# Patient Record
Sex: Female | Born: 1995 | Race: Black or African American | Hispanic: No | Marital: Single | Smoking: Current every day smoker
Health system: Southern US, Community
[De-identification: ages and names within clinical notes are randomized; demographics above are authoritative.]

---

## 2017-07-01 ENCOUNTER — Emergency Department (HOSPITAL_COMMUNITY)
Admission: EM | Admit: 2017-07-01 | Discharge: 2017-07-01 | Disposition: A | Discharge status: Home / Self Care | Payer: Self-pay | Attending: Emergency Medicine | Admitting: Emergency Medicine

## 2017-07-01 ENCOUNTER — Encounter (HOSPITAL_COMMUNITY): Payer: Self-pay

## 2017-07-01 ENCOUNTER — Emergency Department (HOSPITAL_COMMUNITY): Payer: Self-pay

## 2017-07-01 DIAGNOSIS — R110 Nausea: Secondary | ICD-10-CM

## 2017-07-01 DIAGNOSIS — N949 Unspecified condition associated with female genital organs and menstrual cycle: Secondary | ICD-10-CM

## 2017-07-01 DIAGNOSIS — R102 Pelvic and perineal pain: Secondary | ICD-10-CM | POA: Insufficient documentation

## 2017-07-01 DIAGNOSIS — Z3A01 Less than 8 weeks gestation of pregnancy: Secondary | ICD-10-CM | POA: Insufficient documentation

## 2017-07-01 DIAGNOSIS — O99331 Smoking (tobacco) complicating pregnancy, first trimester: Secondary | ICD-10-CM | POA: Insufficient documentation

## 2017-07-01 DIAGNOSIS — O26891 Other specified pregnancy related conditions, first trimester: Secondary | ICD-10-CM | POA: Insufficient documentation

## 2017-07-01 DIAGNOSIS — N8311 Corpus luteum cyst of right ovary: Secondary | ICD-10-CM | POA: Insufficient documentation

## 2017-07-01 DIAGNOSIS — F17200 Nicotine dependence, unspecified, uncomplicated: Secondary | ICD-10-CM | POA: Insufficient documentation

## 2017-07-01 LAB — I-STAT BETA HCG BLOOD, ED (MC, WL, AP ONLY)

## 2017-07-01 LAB — WET PREP, GENITAL
Clue Cells Wet Prep HPF POC: NONE SEEN
SPERM: NONE SEEN
TRICH WET PREP: NONE SEEN
YEAST WET PREP: NONE SEEN

## 2017-07-01 LAB — LIPASE, BLOOD: Lipase: 23 U/L (ref 11–51)

## 2017-07-01 LAB — URINALYSIS, ROUTINE W REFLEX MICROSCOPIC
Bilirubin Urine: NEGATIVE
GLUCOSE, UA: NEGATIVE mg/dL
HGB URINE DIPSTICK: NEGATIVE
Ketones, ur: 5 mg/dL — AB
LEUKOCYTES UA: NEGATIVE
NITRITE: NEGATIVE
PROTEIN: 100 mg/dL — AB
SPECIFIC GRAVITY, URINE: 1.021 (ref 1.005–1.030)
pH: 6 (ref 5.0–8.0)

## 2017-07-01 LAB — COMPREHENSIVE METABOLIC PANEL
ALBUMIN: 4 g/dL (ref 3.5–5.0)
ALK PHOS: 56 U/L (ref 38–126)
ALT: 12 U/L — AB (ref 14–54)
AST: 22 U/L (ref 15–41)
Anion gap: 8 (ref 5–15)
BILIRUBIN TOTAL: 0.6 mg/dL (ref 0.3–1.2)
BUN: 9 mg/dL (ref 6–20)
CO2: 21 mmol/L — ABNORMAL LOW (ref 22–32)
CREATININE: 0.83 mg/dL (ref 0.44–1.00)
Calcium: 9.1 mg/dL (ref 8.9–10.3)
Chloride: 109 mmol/L (ref 101–111)
GFR calc Af Amer: >60 mL/min (ref >60)
Glucose, Bld: 88 mg/dL (ref 65–99)
Potassium: 3.8 mmol/L (ref 3.5–5.1)
Sodium: 138 mmol/L (ref 135–145)
TOTAL PROTEIN: 6.8 g/dL (ref 6.5–8.1)

## 2017-07-01 LAB — HCG, QUANTITATIVE, PREGNANCY: HCG, BETA CHAIN, QUANT, S: 6956 m[IU]/mL — AB (ref <5)

## 2017-07-01 LAB — CBC
HEMATOCRIT: 37.9 % (ref 36.0–46.0)
Hemoglobin: 13 g/dL (ref 12.0–15.0)
MCH: 33.3 pg (ref 26.0–34.0)
MCHC: 34.3 g/dL (ref 30.0–36.0)
MCV: 97.2 fL (ref 78.0–100.0)
PLATELETS: 268 10*3/uL (ref 150–400)
RBC: 3.9 MIL/uL (ref 3.87–5.11)
RDW: 11.5 % (ref 11.5–15.5)
WBC: 9.6 10*3/uL (ref 4.0–10.5)

## 2017-07-01 MED ORDER — ONDANSETRON HCL 4 MG/2ML IJ SOLN
4.0000 mg | Freq: Once | INTRAMUSCULAR | Status: AC
  Administered 2017-07-01: 4 mg via INTRAVENOUS
  Filled 2017-07-01: qty 2

## 2017-07-01 MED ORDER — SODIUM CHLORIDE 0.9 % IV BOLUS (SEPSIS)
1000.0000 mL | Freq: Once | INTRAVENOUS | Status: AC
  Administered 2017-07-01: 1000 mL via INTRAVENOUS

## 2017-07-01 MED ORDER — PRENATAL COMPLETE 14-0.4 MG PO TABS: 1.0000 | ORAL_TABLET | Freq: Every day | ORAL | 2 refills | Status: AC

## 2017-07-01 MED ORDER — ONDANSETRON 4 MG PO TBDP: 4.0000 mg | ORAL_TABLET | Freq: Three times a day (TID) | ORAL | 0 refills | Status: AC | PRN

## 2017-07-01 NOTE — ED Triage Notes (Signed)
Pt reports waking up this morning with LLQ pain and nausea. Denies emesis, fever, chills, diarrhea. Last bm 06/30/17. Denies urinary symptoms

## 2017-07-01 NOTE — ED Provider Notes (Signed)
[facility]ENC[facility] Provider Note   CSN: [id number] Arrival date & time: 07/01/17  1307     History   Chief Complaint Chief Complaint  Patient presents with  . Abdominal Pain  . Nausea    HPI Kaitlin Hammond is a 21 y.o. otherwise healthy [id number] female with a PMHx of SAb several years ago but otherwise no medical problems, who presents to the ED with complaints of LLQ abdominal pain that began at 8 AM this morning after she woke up from sleep. She describes the pain is 7/10 intermittent tugging nonradiating LLQ pain with no known aggravating or alleviating factors, she has not tried anything for her symptoms. She reports associated nausea. LMP was approximately 9/27-10/4 (therefore EGA 4513w0d). She has no PCP or OBGYN here as she just moved here from [place]. She is sexually active with 1 female partner, unprotected. She denies fevers, chills, CP, SOB, vomiting, diarrhea/constipation, obstipation, melena, hematochezia, hematuria, dysuria, vaginal bleeding/discharge, myalgias, arthralgias, numbness, tingling, focal weakness, or any other complaints at this time. Denies recent travel, sick contacts, suspicious food intake, EtOH use, NSAID use, or prior abd surgeries.    The history is provided by the patient and medical records. No language interpreter was used.  Abdominal Pain   This is a new problem. The current episode started 6 to 12 hours ago. The problem occurs rarely. The problem has not changed since onset.The pain is associated with an unknown factor. The pain is located in the LLQ. Quality: tugging. The pain is at a severity of 7/10. The pain is moderate. Associated symptoms include nausea. Pertinent negatives include fever, diarrhea, flatus, hematochezia, melena, vomiting, constipation, dysuria, hematuria, arthralgias and myalgias. Nothing aggravates the symptoms. Nothing relieves the symptoms.    History reviewed. No pertinent past medical  history.  There are no active problems to display for this patient.   History reviewed. No pertinent surgical history.  OB History    No data available       Home Medications    Prior to Admission medications   Not on File    Family History No family history on file.  Social History Social History  Substance Use Topics  . Smoking status: Current Every Day Smoker  . Smokeless tobacco: Never Used  . Alcohol use Yes     Allergies   Shellfish allergy   Review of Systems Review of Systems  Constitutional: Negative for chills and fever.  Respiratory: Negative for shortness of breath.   Cardiovascular: Negative for chest pain.  Gastrointestinal: Positive for abdominal pain and nausea. Negative for blood in stool, constipation, diarrhea, flatus, hematochezia, melena and vomiting.  Genitourinary: Negative for dysuria, hematuria, vaginal bleeding and vaginal discharge.  Musculoskeletal: Negative for arthralgias and myalgias.  Skin: Negative for color change.  Allergic/Immunologic: Negative for immunocompromised state.  Neurological: Negative for weakness and numbness.  Psychiatric/Behavioral: Negative for confusion.   All other systems reviewed and are negative for acute change except as noted in the HPI.    Physical Exam Updated Vital Signs BP 102/74   Pulse 82   Temp 98.5 F (36.9 C) (Oral)   Resp 16   Ht 5\' 7"  (1.702 m)   Wt 52.6 kg (116 lb)   LMP 05/01/2017   SpO2 100%   BMI 18.17 kg/m    Physical Exam  Constitutional: She is oriented to person, place, and time. Vital signs are normal. She appears well-developed and well-nourished.  Non-toxic appearance. No distress.  Afebrile, nontoxic,  NAD  HENT:  Head: Normocephalic and atraumatic.  Mouth/Throat: Oropharynx is clear and moist and mucous membranes are normal.  Eyes: Conjunctivae and EOM are normal. Right eye exhibits no discharge. Left eye exhibits no discharge.  Neck: Normal range of motion. Neck  supple.  Cardiovascular: Normal rate, regular rhythm, normal heart sounds and intact distal pulses.  Exam reveals no gallop and no friction rub.   No murmur heard. Pulmonary/Chest: Effort normal and breath sounds normal. No respiratory distress. She has no decreased breath sounds. She has no wheezes. She has no rhonchi. She has no rales.  Abdominal: Soft. Normal appearance and bowel sounds are normal. She exhibits no distension. There is tenderness in the left lower quadrant. There is no rigidity, no rebound, no guarding, no CVA tenderness, no tenderness at McBurney's point and negative Murphy's sign.  Soft, nondistended, +BS throughout, with mild LLQ TTP, no r/g/r, neg murphy's, neg mcburney's, no CVA TTP   Genitourinary:  Genitourinary Comments: Pt declined pelvic exam  Musculoskeletal: Normal range of motion.  Neurological: She is alert and oriented to person, place, and time. She has normal strength. No sensory deficit.  Skin: Skin is warm, dry and intact. No rash noted.  Psychiatric: She has a normal mood and affect.  Nursing note and vitals reviewed.    ED Treatments / Results  Labs (all labs ordered are listed, but only abnormal results are displayed) Labs Reviewed  WET PREP, GENITAL - Abnormal; Notable for the following:       Result Value   WBC, Wet Prep HPF POC MODERATE (*)    All other components within normal limits  COMPREHENSIVE METABOLIC PANEL - Abnormal; Notable for the following:    CO2 21 (*)    ALT 12 (*)    All other components within normal limits  URINALYSIS, ROUTINE W REFLEX MICROSCOPIC - Abnormal; Notable for the following:    APPearance HAZY (*)    Ketones, ur 5 (*)    Protein, ur 100 (*)    Bacteria, UA RARE (*)    Squamous Epithelial / LPF 6-30 (*)    All other components within normal limits  HCG, QUANTITATIVE, PREGNANCY - Abnormal; Notable for the following:    hCG, Beta Chain, Quant, S 6,956 (*)    All other components within normal limits  I-STAT  BETA HCG BLOOD, ED ([facility], [facility], [facility] ONLY) - Abnormal; Notable for the following:    I-stat hCG, quantitative >2,000.0 (*)    All other components within normal limits  LIPASE, BLOOD  CBC  RPR  HIV ANTIBODY (ROUTINE TESTING)  GC/CHLAMYDIA PROBE AMP ([place]) NOT AT [facility]    EKG  EKG Interpretation None       Radiology US Ob Comp Less 14 Wks  Result Date: 07/01/2017 CLINICAL DATA:  Initial evaluation for acute left lower pelvic pain. EXAM: OBSTETRIC <14 WK [place] AND TRANSVAGINAL OB US DOPPLER ULTRASOUND OF OVARIES TECHNIQUE: Both transabdominal and transvaginal ultrasound examinations were performed for complete evaluation of the gestation as well as the maternal uterus, adnexal regions, and pelvic cul-de-sac. Transvaginal technique was performed to assess early pregnancy. Color and duplex Doppler ultrasound was utilized to evaluate blood flow to the ovaries. COMPARISON:  None. FINDINGS: Intrauterine gestational sac: Single Yolk sac:  Present Embryo:  Not visualized. Cardiac Activity: N/A. Heart Rate: N/A bpm MSD: 6.4  mm   5 w   2  d Subchorionic hemorrhage:  None visualized. Maternal uterus/adnexae: Left ovary normal measuring 4.1 x 1.2 x  1.8 cm. Right ovary measures 3.4 x 2.5 x 3.1 cm. Complex cystic lesion within the right ovary measures 2.4 x 1.3 x 2.2 cm, most likely a corpus luteal cyst. Pulsed Doppler evaluation of both ovaries demonstrates normal appearing low-resistance arterial and venous waveforms. Trace free fluid noted within the pelvis. IMPRESSION: 1. Probable early intrauterine gestational sac with internal yolk sac, but no fetal pole or cardiac activity yet visualized. Estimated gestational age [redacted] weeks and 2 days by mean sac diameter. Recommend follow-up quantitative B-HCG levels and follow-up US in 14 days to assess viability. This recommendation follows SRU consensus guidelines: Diagnostic Criteria for Nonviable Pregnancy Early in the First Trimester. Malva Limes Med 2013; 161:0960-45.  2. 2.4 cm complex right ovarian cyst, most consistent with a normal corpus luteal cyst. Associated trace free pelvic fluid. 3. No other acute abnormality within the pelvis. No evidence for ovarian torsion. Electronically Signed   By: Rise Mu M.D.   On: 07/01/2017 19:05   US Ob Transvaginal  Result Date: 07/01/2017 CLINICAL DATA:  Initial evaluation for acute left lower pelvic pain. EXAM: OBSTETRIC <14 WK [place] AND TRANSVAGINAL OB US DOPPLER ULTRASOUND OF OVARIES TECHNIQUE: Both transabdominal and transvaginal ultrasound examinations were performed for complete evaluation of the gestation as well as the maternal uterus, adnexal regions, and pelvic cul-de-sac. Transvaginal technique was performed to assess early pregnancy. Color and duplex Doppler ultrasound was utilized to evaluate blood flow to the ovaries. COMPARISON:  None. FINDINGS: Intrauterine gestational sac: Single Yolk sac:  Present Embryo:  Not visualized. Cardiac Activity: N/A. Heart Rate: N/A bpm MSD: 6.4  mm   5 w   2  d Subchorionic hemorrhage:  None visualized. Maternal uterus/adnexae: Left ovary normal measuring 4.1 x 1.2 x 1.8 cm. Right ovary measures 3.4 x 2.5 x 3.1 cm. Complex cystic lesion within the right ovary measures 2.4 x 1.3 x 2.2 cm, most likely a corpus luteal cyst. Pulsed Doppler evaluation of both ovaries demonstrates normal appearing low-resistance arterial and venous waveforms. Trace free fluid noted within the pelvis. IMPRESSION: 1. Probable early intrauterine gestational sac with internal yolk sac, but no fetal pole or cardiac activity yet visualized. Estimated gestational age [redacted] weeks and 2 days by mean sac diameter. Recommend follow-up quantitative B-HCG levels and follow-up US in 14 days to assess viability. This recommendation follows SRU consensus guidelines: Diagnostic Criteria for Nonviable Pregnancy Early in the First Trimester. Malva Limes Med 2013; 409:8119-14. 2. 2.4 cm complex right ovarian cyst, most consistent  with a normal corpus luteal cyst. Associated trace free pelvic fluid. 3. No other acute abnormality within the pelvis. No evidence for ovarian torsion. Electronically Signed   By: Rise Mu M.D.   On: 07/01/2017 19:05   US Pelvic Doppler (torsion R/o Or Mass Arterial Flow)  Result Date: 07/01/2017 CLINICAL DATA:  Initial evaluation for acute left lower pelvic pain. EXAM: OBSTETRIC <14 WK [place] AND TRANSVAGINAL OB US DOPPLER ULTRASOUND OF OVARIES TECHNIQUE: Both transabdominal and transvaginal ultrasound examinations were performed for complete evaluation of the gestation as well as the maternal uterus, adnexal regions, and pelvic cul-de-sac. Transvaginal technique was performed to assess early pregnancy. Color and duplex Doppler ultrasound was utilized to evaluate blood flow to the ovaries. COMPARISON:  None. FINDINGS: Intrauterine gestational sac: Single Yolk sac:  Present Embryo:  Not visualized. Cardiac Activity: N/A. Heart Rate: N/A bpm MSD: 6.4  mm   5 w   2  d Subchorionic hemorrhage:  None visualized. Maternal uterus/adnexae: Left ovary  normal measuring 4.1 x 1.2 x 1.8 cm. Right ovary measures 3.4 x 2.5 x 3.1 cm. Complex cystic lesion within the right ovary measures 2.4 x 1.3 x 2.2 cm, most likely a corpus luteal cyst. Pulsed Doppler evaluation of both ovaries demonstrates normal appearing low-resistance arterial and venous waveforms. Trace free fluid noted within the pelvis. IMPRESSION: 1. Probable early intrauterine gestational sac with internal yolk sac, but no fetal pole or cardiac activity yet visualized. Estimated gestational age [redacted] weeks and 2 days by mean sac diameter. Recommend follow-up quantitative B-HCG levels and follow-up US in 14 days to assess viability. This recommendation follows SRU consensus guidelines: Diagnostic Criteria for Nonviable Pregnancy Early in the First Trimester. Malva Limes Med 2013; 161:0960-45. 2. 2.4 cm complex right ovarian cyst, most consistent with a normal  corpus luteal cyst. Associated trace free pelvic fluid. 3. No other acute abnormality within the pelvis. No evidence for ovarian torsion. Electronically Signed   By: Rise Mu M.D.   On: 07/01/2017 19:05    Procedures Procedures (including critical care time)  Medications Ordered in ED Medications  sodium chloride 0.9 % bolus 1,000 mL (1,000 mLs Intravenous New Bag/Given 07/01/17 1700)  ondansetron (ZOFRAN) injection 4 mg (4 mg Intravenous Given 07/01/17 1700)     Initial Impression / Assessment and Plan / ED Course  I have reviewed the triage vital signs and the nursing notes.  Pertinent labs & imaging results that were available during my care of the patient were reviewed by me and considered in my medical decision making (see chart for details).     21 y.o. female here with LLQ pain and nausea since waking up today. On exam, mild LLQ TTP, nonperitoneal; afebrile and nontoxic. Work up thus far reveals: CBC WNL, CMP essentially WNL, lipase WNL, U/A with gross contamination with 6-30 squamous but only 0-5 WBCs and rare bacteria so doubt UTI and doubt benefit of UCx with this grossly contaminated specimen. Istat betaHCG+. Pt declines pelvic exam, will get pelvic U/S to eval for IUP vs ectopic, etc, and get STD check and quantHCG; will get wet prep on blind vaginal swab performed by patient. Will give fluids and zofran (r/b/a discussed with pt and she wishes to proceed with zofran use), pt declines wanting anything for pain, will reassess shortly.   7:17 PM Wet prep with moderate WBCs but otherwise negative. QuantHCG 4098. Pelvic U/S confirms probable early IUP with yolk sac but no fetal pole or cardiac activity yet, sac measures [redacted]w[redacted]d which is consistent with LMP EGA. Also showing R ovarian cyst, likely corpus luteal cyst.  Advised starting prenatals, discussed pelvic rest, tylenol for pain but no NSAIDs, stay hydrated, f/up with OBGYN/women's clinic in 5-7 days to establish prenatal  care and for recheck after today's visit. Strict return precautions discussed, advised going to women's hospital for emergent changes/worsening in symptoms. Discussed abstinence until STD results come back, and f/up with health dept for future STD concerns; Safe sex encouraged, and discussed having partners tested and treated before re-engaging in intercourse after results come back. Will also rx zofran although discussed use of other OTC remedies first and only sparing use of zofran. I explained the diagnosis and have given explicit precautions to return to the ER including for any other new or worsening symptoms. The patient understands and accepts the medical plan as it's been dictated and I have answered their questions. Discharge instructions concerning home care and prescriptions have been given. The patient is STABLE and is  discharged to home in good condition.    Final Clinical Impressions(s) / ED Diagnoses   Final diagnoses:  Pelvic pain in pregnancy, antepartum, first trimester  Nausea  Round ligament pain  Corpus luteum cyst of right ovary    New Prescriptions New Prescriptions   ONDANSETRON (ZOFRAN ODT) 4 MG DISINTEGRATING TABLET    Take 1 tablet (4 mg total) by mouth every 8 (eight) hours as needed for nausea or vomiting.   PRENATAL VIT-FE FUMARATE-FA (PRENATAL COMPLETE) 14-0.4 MG TABS    Take 1 tablet by mouth daily after breakfast.     [address] [place], [place] 07/01/17 Earlean Polka, MD 07/03/17 [phone]

## 2017-07-01 NOTE — Discharge Instructions (Signed)
Pelvic pain in early pregnancy is common, it's usually due to the stretching that occurs to your pelvic organs as the fetus grows. Stay well hydrated and get plenty of rest. Allow your pelvis to rest until symptoms improve, avoiding putting anything in your vagina or having any sexual intercourse until you are rechecked by an OBGYN.   Start taking prenatal vitamins. You can use tylenol safely in pregnancy, use as needed for pain, but avoid NSAIDs like ibuprofen or aleve/motrin/etc. Use over the counter unisom + vitamin B6 to help with nausea, and see the list of foods below that can also help with nausea in pregnancy; if this doesn't help, then use zofran as directed as needed for additional relief of symptoms. Follow the instructions below regarding your diagnosis today. Establish prenatal care with an OBGYN, you can call the women's outpatient clinic to do this, and you will need to be checked in 5-7 days for re-evaluation. [facility] (similar to their version of an emergency room) for changes/worsening symptoms as outlined below.  Also, you have been tested for gonorrhea, chlamydia, HIV, and Syphilis, and the hospital lab will call you if the tests are positive. Do not have sexual intercourse until you've found out about your test results. Make sure all sexual partners are tested and treated before re-engaging in sexual intercourse. Follow up with the health department for future STD concerns/treatment/testing/etc.     HOME CARE INSTRUCTIONS DO NOT USE TAMPONS. Do not douche, have sexual intercourse or orgasms until approved by your caregiver.   SEEK [facility]MM[facility] ATTENTION [facility] IF: You have severe cramps in your stomach, back, or abdomen.  You have a sudden onset of severe pain in the lower part of your abdomen.  You run an unexplained temperature of 101 F (38.3 C) or higher.  You pass large clots or tissue. Save any tissue for your caregiver to inspect.    Your bleeding increases or you become light-headed, weak, or have fainting episodes.

## 2017-07-02 LAB — GC/CHLAMYDIA PROBE AMP (~~LOC~~) NOT AT ARMC
Chlamydia: NEGATIVE
Neisseria Gonorrhea: NEGATIVE

## 2017-07-02 LAB — HIV ANTIBODY (ROUTINE TESTING W REFLEX): HIV SCREEN 4TH GENERATION: NONREACTIVE

## 2017-07-02 LAB — RPR: RPR: NONREACTIVE

## 2018-02-02 IMAGING — US US ART/VEN ABD/PELV/SCROTUM DOPPLER LTD
1 series · 13 of 25 positions shown · non-contrast
Comparison: None.

CLINICAL DATA: Initial evaluation for acute left lower pelvic pain.



[Series 1: us art/ven abd/pelv/scrotum doppler ltd · 0.19mm/px · 74 acquisitions, 13 frames shown]
[im 1/74]
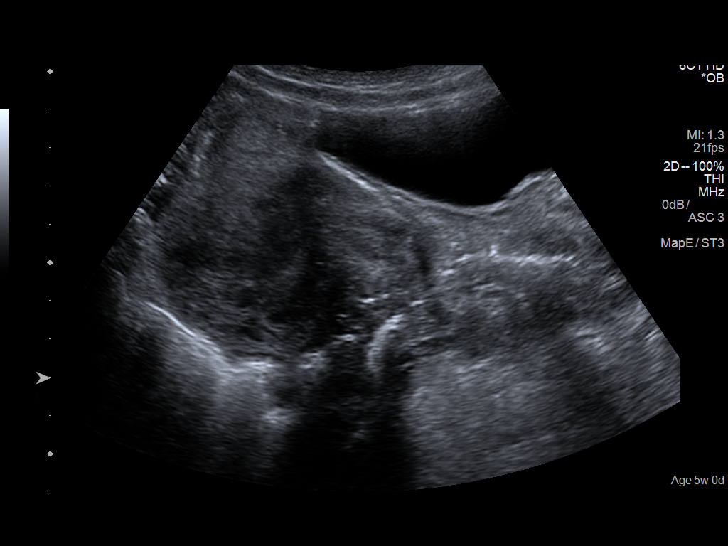
[im 7/74]
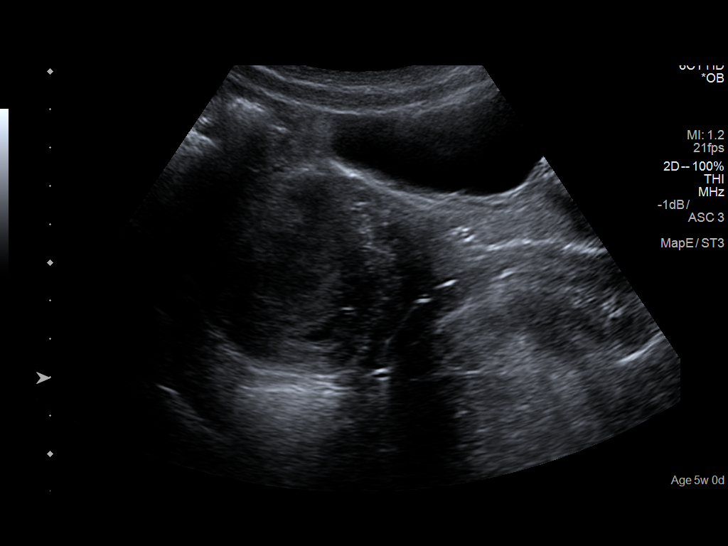
[im 13/74]
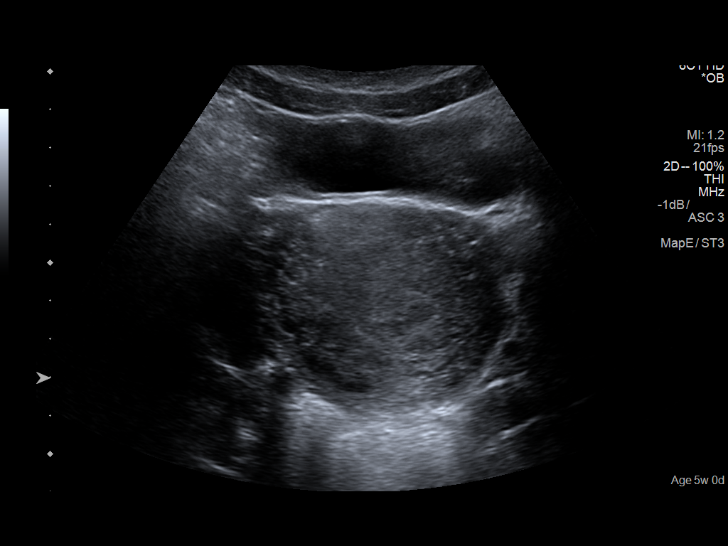
[im 19/74]
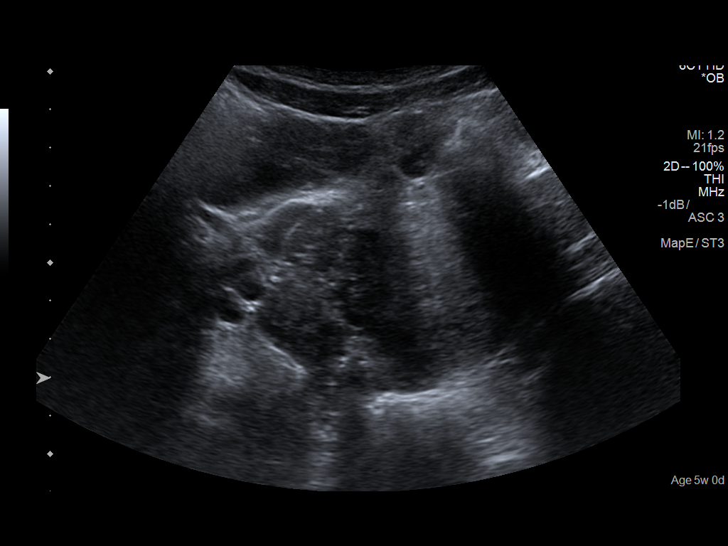
[im 25/74]
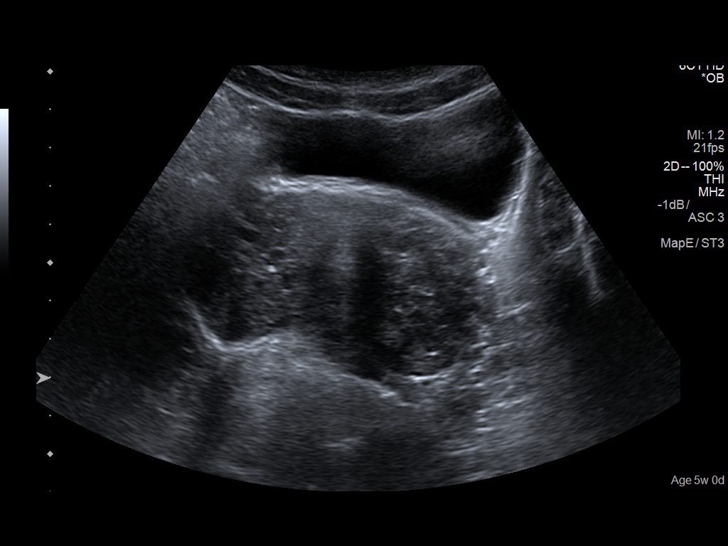
[im 31/74]
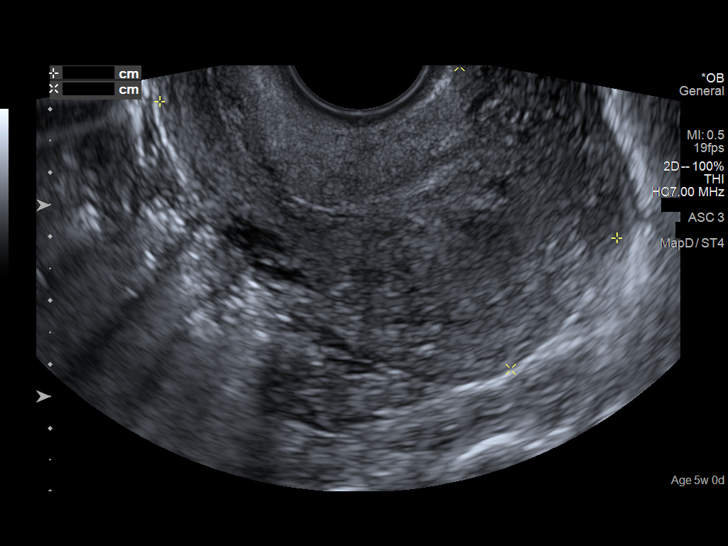
[im 37/74]
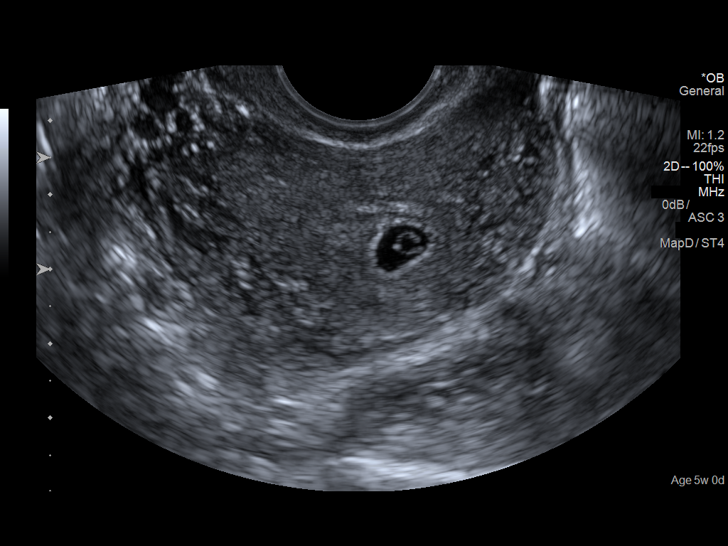
[im 43/74]
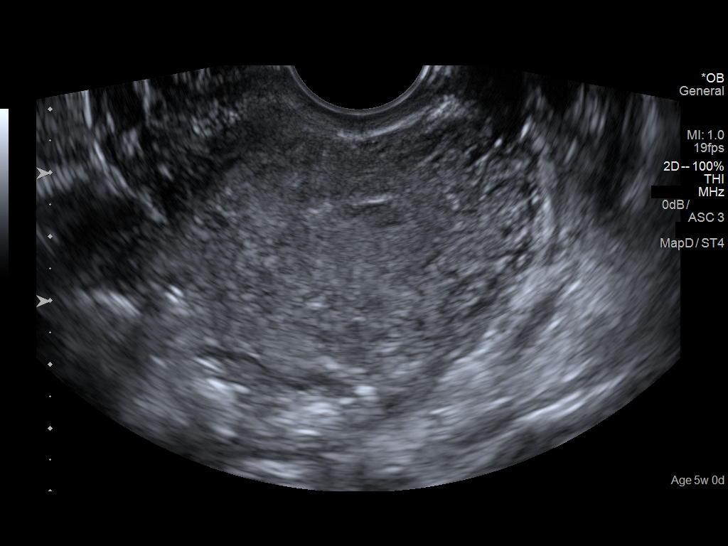
[im 49/74]
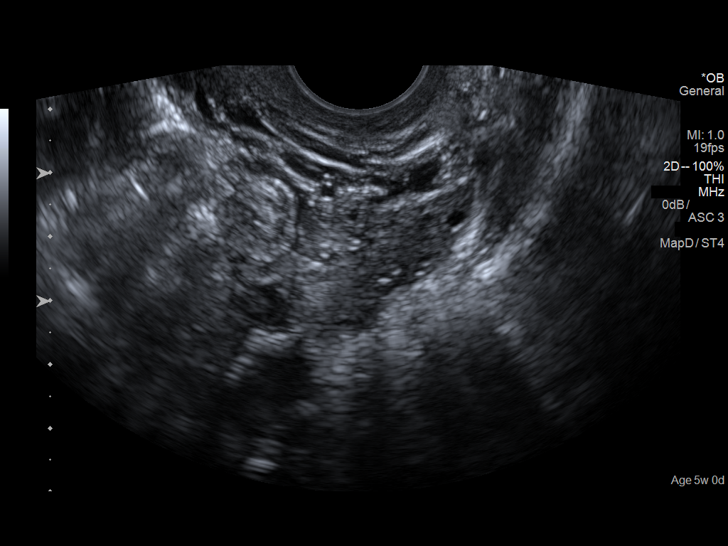
[im 55/74]
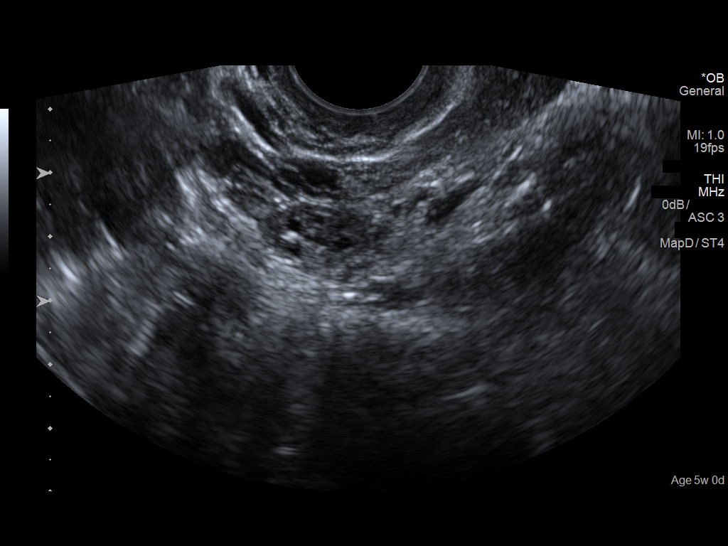
[im 61/74]
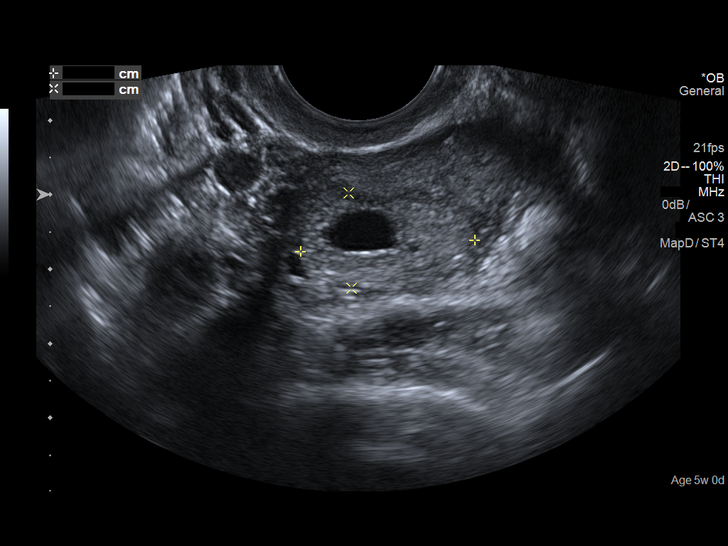
[im 67/74]
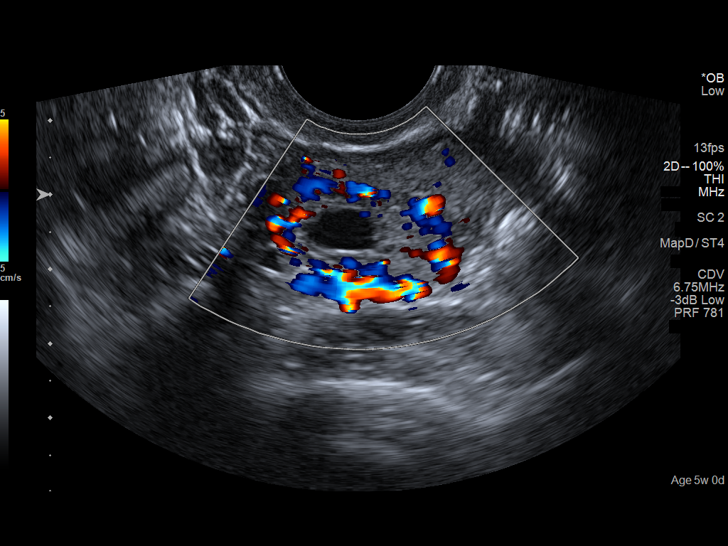
[im 74/74]
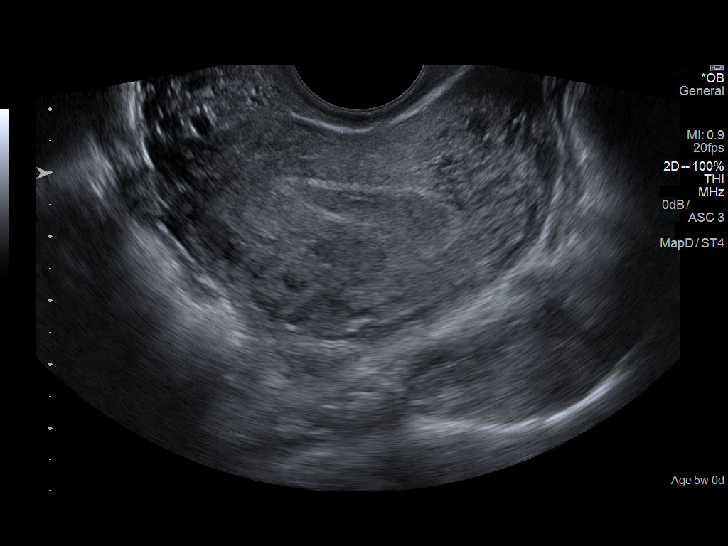

[13 of 25 positions shown; findings below may reference images not displayed]

FINDINGS: Intrauterine gestational sac: Single

Yolk sac:  Present

Embryo:  Not visualized.

Cardiac Activity: N/A.

Heart Rate: N/A bpm

MSD: 6.4  mm   5 w   2  d

Subchorionic hemorrhage:  None visualized.

Maternal uterus/adnexae: Left ovary normal measuring 4.1 x 1.2 x
cm. Right ovary measures 3.4 x 2.5 x 3.1 cm. Complex cystic lesion
within the right ovary measures 2.4 x 1.3 x 2.2 cm, most likely a
corpus luteal cyst.

Pulsed Doppler evaluation of both ovaries demonstrates normal
appearing low-resistance arterial and venous waveforms.

Trace free fluid noted within the pelvis.
IMPRESSION: 1. Probable early intrauterine gestational sac with internal yolk
sac, but no fetal pole or cardiac activity yet visualized. Estimated
gestational age 5 weeks and 2 days by mean sac diameter. Recommend
follow-up quantitative B-HCG levels and follow-up US in 14 days to
assess viability. This recommendation follows SRU consensus
guidelines: Diagnostic Criteria for Nonviable Pregnancy Early in the
First Trimester. N Engl J Med 8873; [DATE].
2. 2.4 cm complex right ovarian cyst, most consistent with a normal
corpus luteal cyst. Associated trace free pelvic fluid.
3. No other acute abnormality within the pelvis. No evidence for
ovarian torsion.
